# Patient Record
Sex: Male | Born: 1990 | Race: White | Hispanic: No | Marital: Single | Smoking: Never smoker
Health system: Southern US, Community
[De-identification: ages and names within clinical notes are randomized; demographics above are authoritative.]

---

## 2020-03-23 ENCOUNTER — Emergency Department (HOSPITAL_COMMUNITY): Payer: Self-pay

## 2020-03-23 ENCOUNTER — Emergency Department (HOSPITAL_COMMUNITY)
Admission: EM | Admit: 2020-03-23 | Discharge: 2020-03-23 | Disposition: A | Discharge status: Home / Self Care | Payer: Self-pay | Attending: Emergency Medicine | Admitting: Emergency Medicine

## 2020-03-23 ENCOUNTER — Encounter (HOSPITAL_COMMUNITY): Payer: Self-pay | Admitting: Emergency Medicine

## 2020-03-23 ENCOUNTER — Other Ambulatory Visit: Payer: Self-pay

## 2020-03-23 DIAGNOSIS — X501XXA Overexertion from prolonged static or awkward postures, initial encounter: Secondary | ICD-10-CM | POA: Insufficient documentation

## 2020-03-23 DIAGNOSIS — Y999 Unspecified external cause status: Secondary | ICD-10-CM | POA: Insufficient documentation

## 2020-03-23 DIAGNOSIS — S93401A Sprain of unspecified ligament of right ankle, initial encounter: Secondary | ICD-10-CM | POA: Insufficient documentation

## 2020-03-23 DIAGNOSIS — Z87891 Personal history of nicotine dependence: Secondary | ICD-10-CM | POA: Insufficient documentation

## 2020-03-23 DIAGNOSIS — M5416 Radiculopathy, lumbar region: Secondary | ICD-10-CM | POA: Insufficient documentation

## 2020-03-23 DIAGNOSIS — Y929 Unspecified place or not applicable: Secondary | ICD-10-CM | POA: Insufficient documentation

## 2020-03-23 DIAGNOSIS — Y9301 Activity, walking, marching and hiking: Secondary | ICD-10-CM | POA: Insufficient documentation

## 2020-03-23 NOTE — Discharge Instructions (Addendum)
Apply ice to your ankle for 30 minutes at a time, 4 times a day.  Use the ankle brace and crutches as needed.  Take over-the-counter analgesics like ibuprofen, naproxen, acetaminophen as needed.  If your ankle is not getting better the way you think it should, or if your back pain and leg numbness persist, follow-up with the orthopedic physician.

## 2020-03-23 NOTE — ED Provider Notes (Signed)
[facility] EMERGENCY DEPARTMENT Provider Note   CSN: [id number] Arrival date & time: 03/23/20  2112   History Chief Complaint  Patient presents with  . Ankle Pain    Charles Stuart is a 29 y.o. male.  The history is provided by the patient.  Ankle Pain He noted onset last night that there was some numbness in the lateral aspect of his right leg.  Today, he twisted his right ankle with an inversion injury while going down some steps.  He is able to ambulate, but it is painful.  He denies other injury.  He does relate that he had helped his sister move and carried a lot of boxes town 3 flights of steps.  He does have history of intermittent back pain and his back has been hurting over the last week.  He denies any bowel or bladder dysfunction he denies any weakness.  History reviewed. No pertinent past medical history.  There are no problems to display for this patient.   History reviewed. No pertinent surgical history.     History reviewed. No pertinent family history.  Social History   Tobacco Use  . Smoking status: Former Games developer  . Smokeless tobacco: Never Used  Vaping Use  . Vaping Use: Never used  Substance Use Topics  . Alcohol use: Never  . Drug use: Never    Home Medications Prior to Admission medications   Not on File    Allergies    Penicillins  Review of Systems   Review of Systems  All other systems reviewed and are negative.   Physical Exam Updated Vital Signs BP 120/76   Pulse 89   Temp 98.2 F (36.8 C)   Resp 18   Ht 5\' 11"  (1.803 m)   Wt 77.1 kg   SpO2 100%   BMI 23.71 kg/m   Physical Exam Vitals and nursing note reviewed.   29 year old male, resting comfortably and in no acute distress. Vital signs are normal. Oxygen saturation is 100%, which is normal. Head is normocephalic and atraumatic. PERRLA, EOMI. Oropharynx is clear. Neck is nontender and supple without adenopathy or JVD. Back is nontender and there is no CVA  tenderness.  Straight leg raise is negative. Lungs are clear without rales, wheezes, or rhonchi. Chest is nontender. Heart has regular rate and rhythm without murmur. Abdomen is soft, flat, nontender without masses or hepatosplenomegaly and peristalsis is normoactive. Extremities have no cyanosis or edema, full range of motion is present.  Right ankle has no swelling or deformity.  There is no instability of the ankle mortise.  Pain is elicited with passive inversion.  Dorsalis pedis pulses strong.  Capillary refill is prompt. Skin is warm and dry without rash. Neurologic: Mental status is normal, cranial nerves are intact, there are no motor sensory deficits.  There is decreased sensation over the lateral aspect of the right calf and dorsum of the right foot consistent with L5 radiculopathy.  ED Results / Procedures / Treatments    Radiology DG Ankle Complete Right  Result Date: 03/23/2020 CLINICAL DATA:  Ankle pain EXAM: RIGHT ANKLE - COMPLETE 3+ VIEW COMPARISON:  None. FINDINGS: There is no evidence of fracture, dislocation, or joint effusion. There is no evidence of arthropathy or other focal bone abnormality. Soft tissues are unremarkable. IMPRESSION: Negative. Electronically Signed   By: 05/23/2020 M.D.   On: 03/23/2020 22:47    Procedures Procedures   Medications Ordered in ED Medications - No data to display  ED Course  I have reviewed the triage vital signs and the nursing notes.  Pertinent imaging results that were available during my care of the patient were reviewed by me and considered in my medical decision making (see chart for details).  MDM Rules/Calculators/A&P Mild sprain of the right ankle.  X-rays show no evidence of fracture.  He has no prior records on file.  He is placed in an ankle splint orthotic and given crutches to use as needed.  Right L5 radiculopathy.  He is advised to use over-the-counter NSAIDs, instructed on proper lifting techniques and proper  posture.  He is referred to orthopedics for follow-up.  Final Clinical Impression(s) / ED Diagnoses Final diagnoses:  Sprain of right ankle, unspecified ligament, initial encounter  Lumbar radiculopathy, right    Rx / DC Orders ED Discharge Orders    None       Dione Booze, MD 03/23/20 2336

## 2020-03-23 NOTE — ED Triage Notes (Signed)
Pt c/o right ankle pain. Numbness began last night and today he twisted the rt ankle and fell.

## 2021-05-15 IMAGING — DX DG ANKLE COMPLETE 3+V*R*
3 series · 3 of 3 positions shown · non-contrast
Comparison: None.

CLINICAL DATA: Ankle pain

EXAM:
RIGHT ANKLE - COMPLETE 3+ VIEW

[ankle ap]
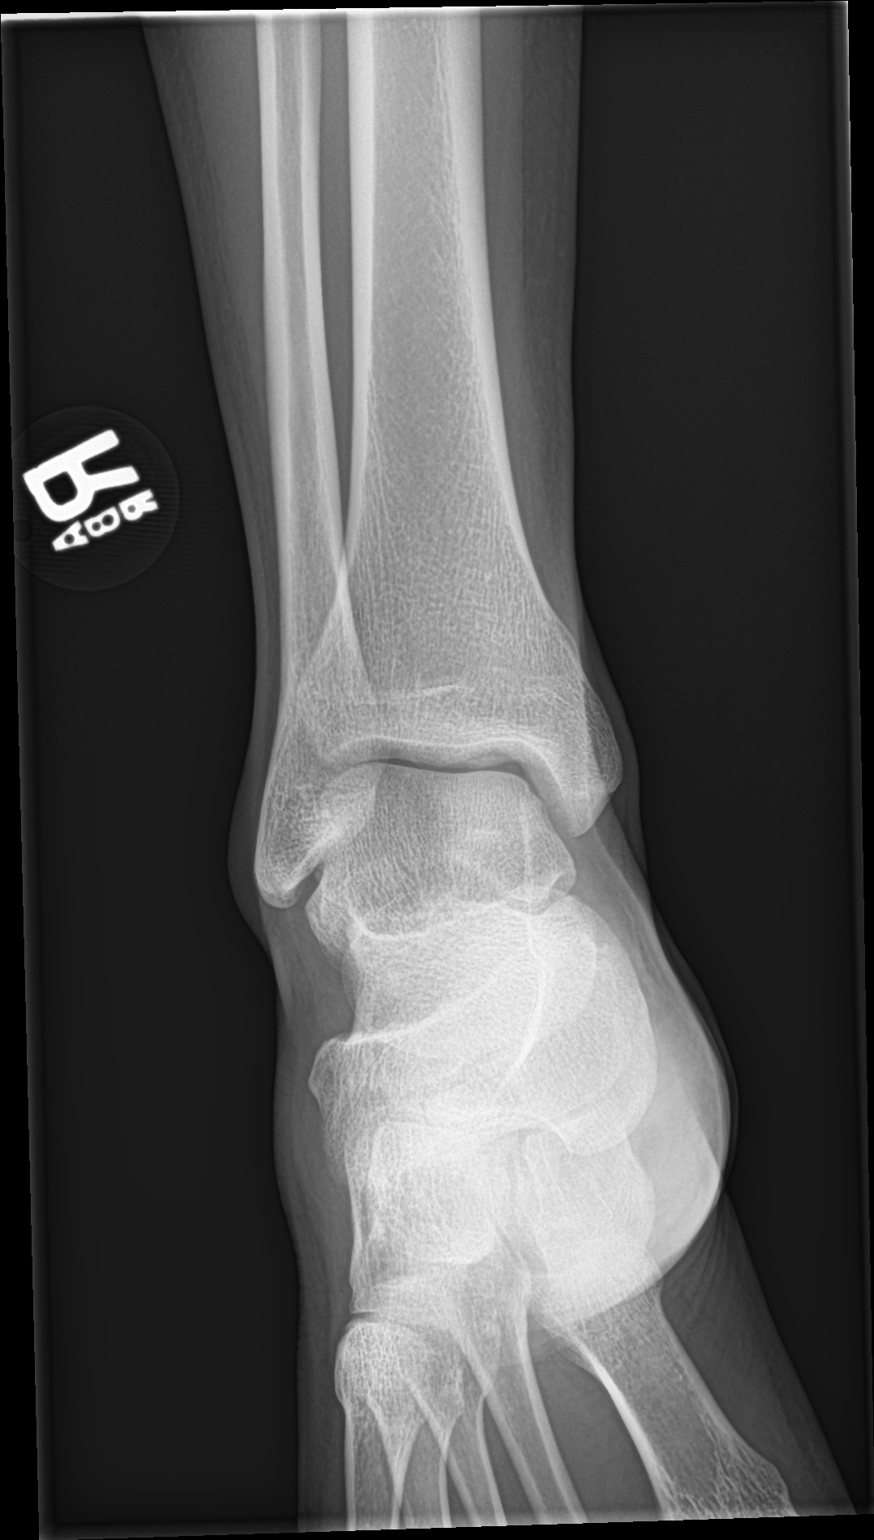

[ankle obl]
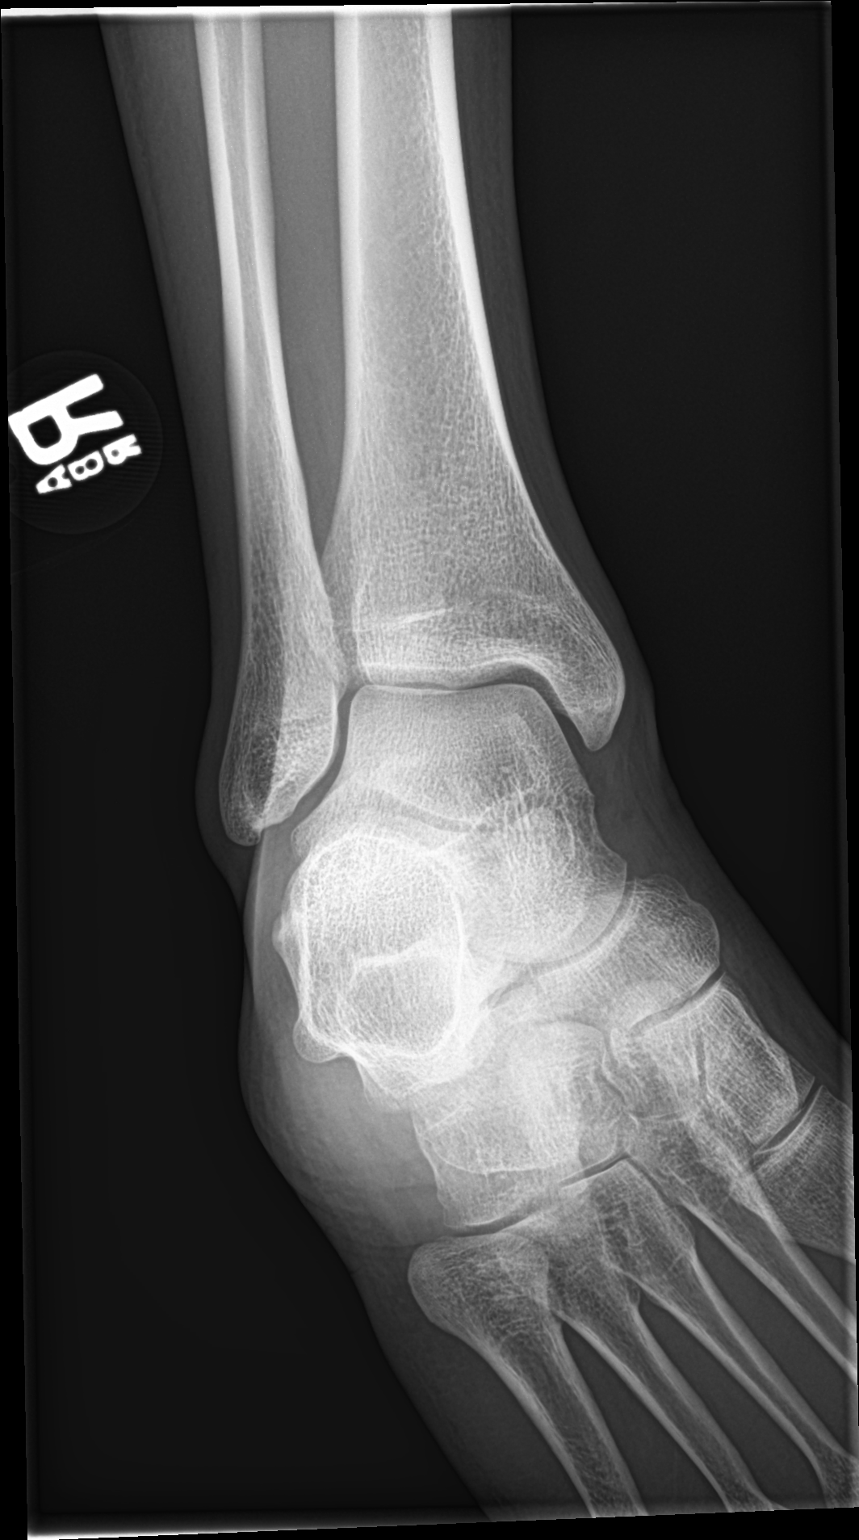

[ankle lat]
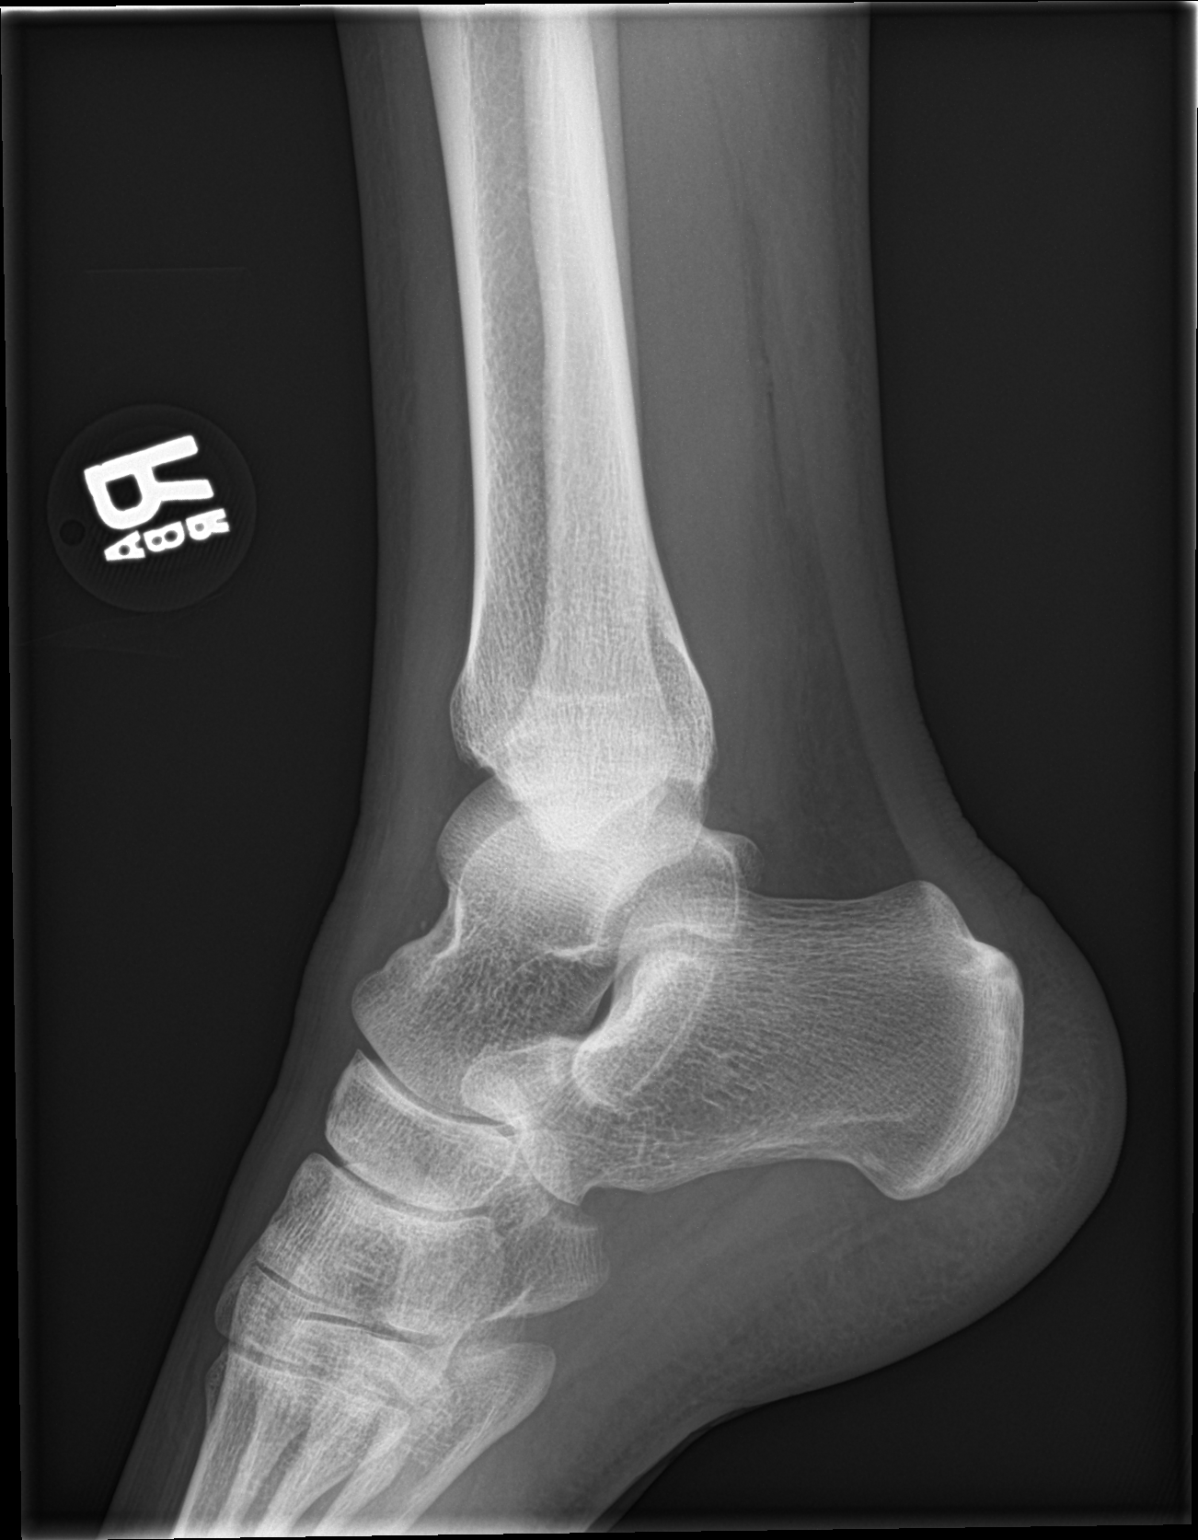

[3 of 3 positions shown; findings below may reference images not displayed]

FINDINGS: There is no evidence of fracture, dislocation, or joint effusion.
There is no evidence of arthropathy or other focal bone abnormality.
Soft tissues are unremarkable.
IMPRESSION: Negative.

## 2023-06-06 NOTE — Congregational Nurse Program (Unsigned)
Dept: [phone]   Congregational Nurse Program Note  Date of Encounter: 06/06/2023  Past Medical History: No past medical history on file.  Encounter Details:  Encounter Questionnaire - 06/06/23 1436       Questionnaire   Ask client: Do you give verbal consent for me to treat you today? Yes    Student Assistance N/A    Location Patient Served  Charles Stuart    Visit Setting with Client Organization    Patient Status Unknown    Insurance Uninsured ([place]/Care Connects/Self-Pay/Medicaid Family Planning)    Insurance/Financial Assistance Referral [facility];Medicaid    Medication Referred to Medication Assistance    Medical Provider No    Screening Referrals Made N/A    Medical Referrals Made [facility]    Medical Appointment Made Non-[facility] PCP/clinic    Recently w/o PCP, now 1st time PCP visit completed due to CNs referral or appointment made N/A    Food Referred to Food Pantry;Have Food Insecurities    Transportation N/A    Housing/Utilities N/A    Interpersonal Safety N/A    Interventions Advocate/Support;Navigate Healthcare System;Case Management;Counsel    Abnormal to Normal Screening Since Last CN Visit N/A    Screenings CN Performed Blood Pressure;Blood Glucose;Weight;Height;Temperature;Pulse Ox    Sent Client to Lab for: N/A    Did client attend any of the following based off CNs referral or appointments made? Food Pantry;Screening;Insurance    ED Visit Averted N/A    Life-Saving Intervention Made N/A             Charles Stuart was a walk in today to enroll in Care Connect, referred from [facility] Burnard Bunting RN  This morning. Charles Stuart is not employed and has no insurance and no income currently. He lives with his parents and some extended family. He states he has not been seen by a provider in greater than 5 years except for ER visit in 2021 when he twisted his ankle.  Allergies: Penicillin unknown reaction.   No Current  Medications  Chief complaints today are dental needs, states he has swollen gums and some broken teeth, cannot remember that last time he was seen by a dentist.  He also reports headaches, denies pain today, states he gets a couple a month, he takes either tylenol or ibuprofen for them, whichever one they have at home. He is unclear what he feels causes them if it is stress or vision changes or possibly his teeth. He denies pain when eating. His last vision exam was about 2 years ago and he wears glasses but states his vision remained blurry after getting his prescription glasses.  Today, Client is alert and oriented to person, place and time, answers hesitantly but appropriately. He appears nervous and anxious, looks down and his legs moving during interview. He denies chest pain or shortness of breath, no abdominal pain, no issues with his bowels he states regular and no blood noted. Denies pain or burning with urination but does report getting up during the night to urinate. His Father has history of cancer and his mother reports she has also history of cancer. Unknown types does report father has had kidney, colon and liver cancers per his mother. He appears unkempt and does admit difficulty with being motivated for his daily care, such as eating and bathing. He denies appetite or wt loss, however when asked when he ate last as of 3PM was the day prior.  Vital signs today: 122/82 left  arm, sitting normal cuff, pulse 88, Temp 98.3. wt 153.6 lbs and Ht 5'11" Oxygen sat 100% . Fasting blood glucose by fingerstick 97 Denies alcohol and denies smoking.  PHQ9 score = 16 1). Little interest or pleasure in doing things = 2 2). Feeling down or depressed or hopeless = 1 3). Trouble falling or staying asleep or sleeping too much = 3  4). Feeling tired or having little energy = 3 5). Poor appetite or overeating = 3 6). Feeling bad about yourself or that you are a failure or have let yourself down or  family down = 2 7). Trouble concentrating on things such as reading the newspaper or watching TV= 1 8). Moving or speaking so slowly that other poepl could notice or opposite being figety or restless = 0 9) thoughts that you would be better off dead or of hurting yourself = 1  Client denies any thoughts of SI or HI today, he reports last thoughts of he'd be better off dead was about 2 months ago, he states he has had no plan. No past MH treatment or diagnosis. He does share that he is feel stressed when around people and interacting with others. He feels that some of his feelings are related to not working. He has worked in the past, but was 3rd shift and he didn't have to interact with many people.   We discussed primary care options: [facility] chooses [facility], we discussed the hours and that if he were to get Medicaid he would need to change providers, but we would be able to provide list of those accepting new patients. We secured appointment for 06/13/23 at 10 am and provided client with written date time and contact information for [facility] and discussed cancellation policy. We also discussed that [facility] does have a [facility] that he could possibly see virtually for counseling and recommended he discuss that with the provider and also his dental needs and any other health concerns He states understanding.  Plan: [facility] at 10 am Follow up by phone after appt by this RN  Applied with A. Spruill for Medicaid on 06/06/23 Applied for [place] [facility] with A. Spruill on 06/06/23  Care Connect eligible from 06/06/23- 06/06/24 or until [facility] approved.  Social determinant needs identified Food insecurity: shopped [facility] Transport planner for household of 6 Resource guide provided for other resources.   Francee Nodal RN [employer]

## 2023-06-13 ENCOUNTER — Ambulatory Visit: Payer: Self-pay | Admitting: Physician Assistant

## 2023-06-13 ENCOUNTER — Encounter: Payer: Self-pay | Admitting: Physician Assistant

## 2023-06-13 VITALS — BP 128/87 | HR 98 | Temp 97.1°F | Ht 69.5 in | Wt 154.0 lb

## 2023-06-13 DIAGNOSIS — Z7689 Persons encountering health services in other specified circumstances: Secondary | ICD-10-CM

## 2023-06-13 DIAGNOSIS — F32A Depression, unspecified: Secondary | ICD-10-CM | POA: Insufficient documentation

## 2023-06-13 DIAGNOSIS — K029 Dental caries, unspecified: Secondary | ICD-10-CM

## 2023-06-13 MED ORDER — CITALOPRAM HYDROBROMIDE 20 MG PO TABS: 20.0000 mg | ORAL_TABLET | Freq: Every day | ORAL | 0 refills | Status: AC

## 2023-06-13 NOTE — Progress Notes (Signed)
BP 128/87   Pulse 98   Temp (!) 97.1 F (36.2 C)   Ht 5' 9.5" (1.765 m)   Wt 154 lb (69.9 kg)   SpO2 99%   BMI 22.42 kg/m    Subjective:    Patient ID: Charles Stuart, male    DOB: 03-24-1991, 32 y.o.   MRN: [id number]  HPI: Charles Stuart is a 32 y.o. male presenting on 06/13/2023 for New Patient (Initial Visit)   HPI   Chief Complaint  Patient presents with   New Patient (Initial Visit)    Pt is 32yoM who is in today to establish care.  He says he has not had PCP in years and years.    He graduated from high school and is currently living with his parents.  His sister and her two children also live there.  The kids are about 7yo and he feels they are destructive and it makes for a stressful living environment.   Additionally, his father is getting treatment for cancer.   He is not working currently.  He says it has been about 10 years since he last had a job.   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    He says he has had Several years of anxiety and depression.  He has had Some SI.  His most recently a few months ago.  He has no plan, he just was thinking he would be better off not here.  He has no SI currently.  Pt says he had no anxiety or depression in high school.    He Never got covid vaccination  He Requests dentist also  He doesn't drive due to he gets really anxious   Relevant past medical, surgical, family and social history reviewed and updated as indicated. Interim medical history since our last visit reviewed. Allergies and medications reviewed and updated.  CURRENT MEDS: none    Review of Systems  Per HPI unless specifically indicated above     Objective:    BP 128/87   Pulse 98   Temp (!) 97.1 F (36.2 C)   Ht 5' 9.5" (1.765 m)   Wt 154 lb (69.9 kg)   SpO2 99%   BMI 22.42 kg/m   Wt Readings from Last 3 Encounters:  06/13/23 154 lb (69.9 kg)  06/06/23 153 lb 9.6 oz (69.7 kg)  03/23/20 170  lb (77.1 kg)    Physical Exam Vitals reviewed.  Constitutional:      General: He is not in acute distress.    Appearance: He is well-developed. He is not toxic-appearing.     Comments: Poor grooming, hair greasy, body odor  HENT:     Head: Normocephalic and atraumatic.     Right Ear: Tympanic membrane, ear canal and external ear normal.     Left Ear: Tympanic membrane, ear canal and external ear normal.     Mouth/Throat:     Dentition: Abnormal dentition. Dental caries present. No dental abscesses.     Comments: All front upper teeth decayed down to gumline.  No abscess.  Eyes:     Extraocular Movements: Extraocular movements intact.     Conjunctiva/sclera: Conjunctivae normal.     Pupils: Pupils are equal, round, and reactive to light.  Neck:     Thyroid: No thyromegaly.  Cardiovascular:     Rate and Rhythm: Normal rate and regular rhythm.  Pulmonary:     Effort: Pulmonary effort is normal. No  respiratory distress.     Breath sounds: Normal breath sounds. No wheezing or rales.  Abdominal:     General: Bowel sounds are normal.     Palpations: Abdomen is soft. There is no hepatomegaly, splenomegaly, mass or pulsatile mass.     Tenderness: There is no abdominal tenderness.  Musculoskeletal:     Cervical back: Neck supple.     Right lower leg: No edema.     Left lower leg: No edema.  Lymphadenopathy:     Cervical: No cervical adenopathy.  Skin:    General: Skin is warm and dry.     Findings: No rash.  Neurological:     Mental Status: He is alert and oriented to person, place, and time.     Motor: No weakness or tremor.     Gait: Gait is intact. Gait normal.     Deep Tendon Reflexes:     Reflex Scores:      Patellar reflexes are 2+ on the right side and 2+ on the left side. Psychiatric:        Mood and Affect: Mood is anxious.        Speech: Speech normal.        Behavior: Behavior normal. Behavior is cooperative.     Comments: Pt engaged and responds to all questions     Phq-9 score 15 GAD-7 score 9           Assessment & Plan:    Encounter Diagnoses  Name Primary?   Encounter to establish care Yes   Dental decay    Anxiety and depression     -Discussed with pt that best course to help with his MH is medication and counseling.   He is in agreement.  He is given rx Citalopram and is to schedule with [facility] asap -pt is referred to Dentist -pt to follow up with me 3 weeks.  He is to contact office sooner prn

## 2023-06-14 ENCOUNTER — Telehealth: Payer: Self-pay

## 2023-06-14 ENCOUNTER — Ambulatory Visit: Payer: Self-pay

## 2023-06-14 NOTE — Telephone Encounter (Addendum)
Attempted follow up to client after his appointment at [facility]. No answer, left message requesting return call. Client has also been approved for [place] Medicaid and needs to locate and establish care with a new provider.    Francee Nodal RN [employer]

## 2023-07-04 ENCOUNTER — Ambulatory Visit: Payer: Self-pay | Admitting: Physician Assistant

## 2024-05-10 ENCOUNTER — Encounter (HOSPITAL_COMMUNITY): Payer: Self-pay

## 2024-05-10 ENCOUNTER — Emergency Department (HOSPITAL_COMMUNITY)
Admission: EM | Admit: 2024-05-10 | Discharge: 2024-05-10 | Disposition: A | Discharge status: Home / Self Care | Attending: Emergency Medicine | Admitting: Emergency Medicine

## 2024-05-10 ENCOUNTER — Other Ambulatory Visit: Payer: Self-pay

## 2024-05-10 DIAGNOSIS — K047 Periapical abscess without sinus: Secondary | ICD-10-CM | POA: Insufficient documentation

## 2024-05-10 DIAGNOSIS — K0889 Other specified disorders of teeth and supporting structures: Secondary | ICD-10-CM | POA: Diagnosis present

## 2024-05-10 MED ORDER — NAPROXEN 500 MG PO TABS: 500.0000 mg | ORAL_TABLET | Freq: Two times a day (BID) | ORAL | 0 refills | Status: AC

## 2024-05-10 MED ORDER — CLINDAMYCIN HCL 300 MG PO CAPS: 300.0000 mg | ORAL_CAPSULE | Freq: Four times a day (QID) | ORAL | 0 refills | Status: AC

## 2024-05-10 NOTE — ED Provider Notes (Signed)
 [place] EMERGENCY DEPARTMENT AT [facility] Provider Note   CSN: [id number] Arrival date & time: 05/10/24  9064     Patient presents with: Dental Pain   Charles Stuart is a 33 y.o. male.    Dental Pain    This patient is a 33 year old male presenting with dental pain on the left lower jaw, he has very poor dentition chronically, he states that he has tried to call dentist but they do not take his Medicaid, over the last couple of days he has had increasing pain in the left lower jaw, no shortness of breath, no fevers or vomiting  Prior to Admission medications   Medication Sig Start Date End Date Taking? Authorizing Provider  clindamycin  (CLEOCIN ) 300 MG capsule Take 1 capsule (300 mg total) by mouth 4 (four) times daily for 10 days. May dispense as 150mg  capsules 05/10/24 05/20/24 Yes Cleotilde Rogue, MD  naproxen  (NAPROSYN ) 500 MG tablet Take 1 tablet (500 mg total) by mouth 2 (two) times daily with a meal. 05/10/24  Yes Cleotilde Rogue, MD  citalopram  (CELEXA ) 20 MG tablet Take 1 tablet (20 mg total) by mouth daily. 06/13/23   Comer Kirsch, PA-C    Allergies: Penicillins    Review of Systems  All other systems reviewed and are negative.   Updated Vital Signs BP 132/88 (BP Location: Right Arm)   Pulse 98   Temp 99.7 F (37.6 C) (Oral)   Resp 18   Ht 1.765 m (5' 9.5)   Wt 68 kg   SpO2 99%   BMI 21.83 kg/m   Physical Exam Vitals and nursing note reviewed.  Constitutional:      General: He is not in acute distress.    Appearance: He is well-developed.  HENT:     Head: Normocephalic and atraumatic.     Nose: Nose normal. No congestion or rhinorrhea.     Mouth/Throat:     Mouth: Mucous membranes are moist.     Comments: There is no trismus or torticollis, the patient is able to fully open and close his mouth, he does have visible tenderness and swelling to the left lower jaw, it is asymmetrical, there is no purulent or fluctuant collection of fluids  in that area but there is tenderness over the gingiva, multiple teeth are degraded down to the level of the gumline.  There is no tenderness under the tongue, normal phonation, no significant lymphadenopathy of the neck Eyes:     General: No scleral icterus.       Right eye: No discharge.        Left eye: No discharge.     Conjunctiva/sclera: Conjunctivae normal.     Pupils: Pupils are equal, round, and reactive to light.  Neck:     Thyroid: No thyromegaly.     Vascular: No JVD.  Cardiovascular:     Rate and Rhythm: Normal rate and regular rhythm.     Heart sounds: Normal heart sounds. No murmur heard.    No friction rub. No gallop.  Pulmonary:     Effort: Pulmonary effort is normal. No respiratory distress.     Breath sounds: Normal breath sounds. No wheezing or rales.  Abdominal:     General: Bowel sounds are normal. There is no distension.     Palpations: Abdomen is soft. There is no mass.     Tenderness: There is no abdominal tenderness.  Musculoskeletal:        General: No tenderness. Normal range of motion.  Cervical back: Normal range of motion and neck supple.  Lymphadenopathy:     Cervical: No cervical adenopathy.  Skin:    General: Skin is warm and dry.     Findings: No erythema or rash.  Neurological:     Mental Status: He is alert.     Coordination: Coordination normal.  Psychiatric:        Behavior: Behavior normal.     (all labs ordered are listed, but only abnormal results are displayed) Labs Reviewed - No data to display  EKG: None  Radiology: No results found.   Procedures   Medications Ordered in the ED - No data to display                                  Medical Decision Making  Early dental abscess, no evidence of anything that needs to be drained, no evidence of Ludwig's angina, vitals reviewed, no fever or tachycardia, allergy to penicillin, start on clindamycin , referred to health department and local dentistry, patient  agreeable  He understands indications for return     Final diagnoses:  Dental abscess    ED Discharge Orders          Ordered    clindamycin  (CLEOCIN ) 300 MG capsule  4 times daily        05/10/24 1007    naproxen  (NAPROSYN ) 500 MG tablet  2 times daily with meals        05/10/24 1007               Cleotilde Rogue, MD 05/10/24 1010

## 2024-05-10 NOTE — Discharge Instructions (Signed)
 You will need to follow-up with a dentist for further care as you have multiple teeth that likely need to be pulled.  In the meantime take clindamycin  4 times a day, Naprosyn  twice a day for pain, you will need to go to the dentist within 10 days, ER for severe or worsening symptoms

## 2024-05-10 NOTE — ED Triage Notes (Signed)
 Pt arrived via POV c/o left lower dental pain. Pt denies oral trauma. Pt reports poor dental hygiene and presents in Triage with swelling to his face. Pt also endorses left ear pain and throat tenderness.
